# Patient Record
Sex: Male | Born: 1970 | Race: Black or African American | Hispanic: No | Marital: Married | Smoking: Current every day smoker
Health system: Southern US, Community
[De-identification: ages and names within clinical notes are randomized; demographics above are authoritative.]

## PROBLEM LIST (undated history)

## (undated) DIAGNOSIS — E119 Type 2 diabetes mellitus without complications: Secondary | ICD-10-CM

## (undated) DIAGNOSIS — I10 Essential (primary) hypertension: Secondary | ICD-10-CM

## (undated) HISTORY — DX: Type 2 diabetes mellitus without complications: E11.9

## (undated) HISTORY — DX: Essential (primary) hypertension: I10

---

## 2001-01-13 ENCOUNTER — Encounter: Payer: Self-pay | Admitting: *Deleted

## 2001-01-13 ENCOUNTER — Emergency Department (HOSPITAL_COMMUNITY): Admission: EM | Admit: 2001-01-13 | Discharge: 2001-01-13 | Payer: Self-pay | Admitting: *Deleted

## 2004-10-18 ENCOUNTER — Ambulatory Visit (HOSPITAL_COMMUNITY): Admission: RE | Admit: 2004-10-18 | Discharge: 2004-10-18 | Payer: Self-pay | Admitting: Family Medicine

## 2004-12-16 ENCOUNTER — Ambulatory Visit: Payer: Self-pay | Admitting: Cardiology

## 2005-01-22 ENCOUNTER — Ambulatory Visit (HOSPITAL_COMMUNITY): Admission: RE | Admit: 2005-01-22 | Discharge: 2005-01-22 | Payer: Self-pay | Admitting: Family Medicine

## 2005-05-23 ENCOUNTER — Ambulatory Visit (HOSPITAL_COMMUNITY): Admission: RE | Admit: 2005-05-23 | Discharge: 2005-05-23 | Payer: Self-pay | Admitting: Family Medicine

## 2005-06-23 ENCOUNTER — Emergency Department (HOSPITAL_COMMUNITY): Admission: EM | Admit: 2005-06-23 | Discharge: 2005-06-23 | Payer: Self-pay | Admitting: Emergency Medicine

## 2005-06-24 ENCOUNTER — Ambulatory Visit (HOSPITAL_COMMUNITY): Admission: RE | Admit: 2005-06-24 | Discharge: 2005-06-24 | Payer: Self-pay | Admitting: General Surgery

## 2005-07-10 ENCOUNTER — Emergency Department (HOSPITAL_COMMUNITY): Admission: EM | Admit: 2005-07-10 | Discharge: 2005-07-11 | Payer: Self-pay | Admitting: Emergency Medicine

## 2005-08-13 ENCOUNTER — Ambulatory Visit (HOSPITAL_COMMUNITY): Admission: RE | Admit: 2005-08-13 | Discharge: 2005-08-13 | Payer: Self-pay | Admitting: Family Medicine

## 2005-08-15 ENCOUNTER — Encounter (HOSPITAL_COMMUNITY): Admission: RE | Admit: 2005-08-15 | Discharge: 2005-09-14 | Payer: Self-pay | Admitting: Family Medicine

## 2005-10-15 ENCOUNTER — Encounter: Payer: Self-pay | Admitting: Physician Assistant

## 2005-12-03 ENCOUNTER — Encounter: Payer: Self-pay | Admitting: Physician Assistant

## 2006-05-28 ENCOUNTER — Emergency Department (HOSPITAL_COMMUNITY): Admission: EM | Admit: 2006-05-28 | Discharge: 2006-05-29 | Payer: Self-pay | Admitting: Emergency Medicine

## 2006-07-10 ENCOUNTER — Emergency Department (HOSPITAL_COMMUNITY): Admission: EM | Admit: 2006-07-10 | Discharge: 2006-07-10 | Payer: Self-pay | Admitting: Emergency Medicine

## 2006-07-27 ENCOUNTER — Ambulatory Visit: Payer: Self-pay | Admitting: Gastroenterology

## 2006-07-31 ENCOUNTER — Ambulatory Visit (HOSPITAL_COMMUNITY): Admission: RE | Admit: 2006-07-31 | Discharge: 2006-07-31 | Payer: Self-pay | Admitting: Gastroenterology

## 2006-07-31 ENCOUNTER — Encounter (INDEPENDENT_AMBULATORY_CARE_PROVIDER_SITE_OTHER): Payer: Self-pay | Admitting: Specialist

## 2006-07-31 ENCOUNTER — Ambulatory Visit: Payer: Self-pay | Admitting: Gastroenterology

## 2006-09-08 ENCOUNTER — Ambulatory Visit: Payer: Self-pay | Admitting: Gastroenterology

## 2006-11-04 ENCOUNTER — Ambulatory Visit (HOSPITAL_COMMUNITY): Admission: RE | Admit: 2006-11-04 | Discharge: 2006-11-04 | Payer: Self-pay | Admitting: Gastroenterology

## 2006-12-15 ENCOUNTER — Encounter (HOSPITAL_COMMUNITY): Admission: RE | Admit: 2006-12-15 | Discharge: 2007-01-14 | Payer: Self-pay | Admitting: Oncology

## 2006-12-15 ENCOUNTER — Ambulatory Visit (HOSPITAL_COMMUNITY): Payer: Self-pay | Admitting: Oncology

## 2007-02-08 ENCOUNTER — Ambulatory Visit (HOSPITAL_COMMUNITY): Payer: Self-pay | Admitting: Oncology

## 2007-02-08 ENCOUNTER — Encounter (HOSPITAL_COMMUNITY): Admission: RE | Admit: 2007-02-08 | Discharge: 2007-03-10 | Payer: Self-pay | Admitting: Oncology

## 2007-04-29 ENCOUNTER — Encounter (INDEPENDENT_AMBULATORY_CARE_PROVIDER_SITE_OTHER): Payer: Self-pay | Admitting: Urology

## 2007-04-29 ENCOUNTER — Other Ambulatory Visit: Admission: RE | Admit: 2007-04-29 | Discharge: 2007-04-29 | Payer: Self-pay | Admitting: Urology

## 2007-09-01 ENCOUNTER — Ambulatory Visit (HOSPITAL_COMMUNITY): Payer: Self-pay | Admitting: Oncology

## 2007-09-01 ENCOUNTER — Encounter (HOSPITAL_COMMUNITY): Admission: RE | Admit: 2007-09-01 | Discharge: 2007-10-01 | Payer: Self-pay | Admitting: Oncology

## 2008-02-18 ENCOUNTER — Ambulatory Visit (HOSPITAL_COMMUNITY): Admission: RE | Admit: 2008-02-18 | Discharge: 2008-02-18 | Payer: Self-pay | Admitting: Family Medicine

## 2008-07-28 ENCOUNTER — Ambulatory Visit (HOSPITAL_COMMUNITY): Admission: RE | Admit: 2008-07-28 | Discharge: 2008-07-28 | Payer: Self-pay | Admitting: Family Medicine

## 2008-09-05 ENCOUNTER — Ambulatory Visit (HOSPITAL_COMMUNITY): Payer: Self-pay | Admitting: Oncology

## 2008-09-05 ENCOUNTER — Encounter (HOSPITAL_COMMUNITY): Admission: RE | Admit: 2008-09-05 | Discharge: 2008-10-05 | Payer: Self-pay | Admitting: Oncology

## 2008-10-03 ENCOUNTER — Ambulatory Visit (HOSPITAL_COMMUNITY): Admission: RE | Admit: 2008-10-03 | Discharge: 2008-10-03 | Payer: Self-pay | Admitting: Family Medicine

## 2009-07-11 ENCOUNTER — Ambulatory Visit: Payer: Self-pay | Admitting: Family Medicine

## 2009-07-11 DIAGNOSIS — R0789 Other chest pain: Secondary | ICD-10-CM | POA: Insufficient documentation

## 2009-07-11 DIAGNOSIS — G43909 Migraine, unspecified, not intractable, without status migrainosus: Secondary | ICD-10-CM | POA: Insufficient documentation

## 2009-07-11 DIAGNOSIS — L509 Urticaria, unspecified: Secondary | ICD-10-CM | POA: Insufficient documentation

## 2009-07-12 ENCOUNTER — Encounter: Payer: Self-pay | Admitting: Physician Assistant

## 2009-07-17 ENCOUNTER — Telehealth: Payer: Self-pay | Admitting: Physician Assistant

## 2009-08-14 ENCOUNTER — Ambulatory Visit: Payer: Self-pay | Admitting: Family Medicine

## 2009-08-14 DIAGNOSIS — J301 Allergic rhinitis due to pollen: Secondary | ICD-10-CM | POA: Insufficient documentation

## 2009-08-14 DIAGNOSIS — Z86718 Personal history of other venous thrombosis and embolism: Secondary | ICD-10-CM | POA: Insufficient documentation

## 2009-09-04 ENCOUNTER — Encounter: Payer: Self-pay | Admitting: Physician Assistant

## 2009-09-04 ENCOUNTER — Ambulatory Visit (HOSPITAL_COMMUNITY): Payer: Self-pay | Admitting: Oncology

## 2009-09-04 ENCOUNTER — Encounter (HOSPITAL_COMMUNITY): Admission: RE | Admit: 2009-09-04 | Discharge: 2009-10-04 | Payer: Self-pay | Admitting: Oncology

## 2009-10-11 ENCOUNTER — Telehealth: Payer: Self-pay | Admitting: Physician Assistant

## 2009-10-26 ENCOUNTER — Emergency Department (HOSPITAL_COMMUNITY): Admission: EM | Admit: 2009-10-26 | Discharge: 2009-10-26 | Payer: Self-pay | Admitting: Emergency Medicine

## 2010-05-11 ENCOUNTER — Encounter: Payer: Self-pay | Admitting: Cardiovascular Disease

## 2010-05-21 NOTE — Progress Notes (Signed)
Summary: [facility]  [facility]   Imported By: Lind Guest 09/28/2009 15:58:38  _____________________________________________________________________  External Attachment:    Type:   Image     Comment:   External Document

## 2010-05-21 NOTE — Progress Notes (Signed)
Summary: [facility]  [facility]   Imported By: Lind Guest 07/12/2009 14:31:01  _____________________________________________________________________  External Attachment:    Type:   Image     Comment:   External Document

## 2010-05-21 NOTE — Progress Notes (Signed)
  Phone Note From Pharmacy   Caller: [facility] Summary of Call: atacand 8mg  requires prior auth Initial call taken by: Adella Hare LPN,  July 17, 2009 2:39 PM  Follow-up for Phone Call        Let's hold off on this med for now then. Advise pt since it's not covered by his ins we will see how he does with the higher dose of Topamax first. Follow-up by: Esperanza Sheets PA,  July 17, 2009 3:03 PM  Additional Follow-up for Phone Call Additional follow up Details #1::        called patient, number disconnected Additional Follow-up by: Adella Hare LPN,  July 17, 2009 3:29 PM

## 2010-05-21 NOTE — Progress Notes (Signed)
Summary: call  Phone Note Call from Patient   Summary of Call: he has found another doctor Initial call taken by: Lind Guest,  October 11, 2009 9:57 AM  Follow-up for Phone Call        Noted. Follow-up by: Esperanza Sheets PA,  October 11, 2009 10:19 AM

## 2010-05-21 NOTE — Letter (Signed)
Summary: MEDICAL RELEASE  MEDICAL RELEASE   Imported By: Lind Guest 07/12/2009 14:15:08  _____________________________________________________________________  External Attachment:    Type:   Image     Comment:   External Document

## 2010-05-21 NOTE — Progress Notes (Signed)
Summary: [facility]  [facility]   Imported By: Lind Guest 07/12/2009 14:31:48  _____________________________________________________________________  External Attachment:    Type:   Image     Comment:   External Document

## 2010-05-21 NOTE — Assessment & Plan Note (Signed)
Summary: follow up / migraine - room 1   Vital Signs:  Patient profile:   40 year old male Height:      70 inches Weight:      236 pounds BMI:     33.98 O2 Sat:      99 % on Room air Pulse rate:   100 / minute Resp:     16 per minute BP sitting:   140 / 84  (left arm)  Vitals Entered By: Adella Hare LPN (August 14, 2009 4:13 PM) CC: follow up/ migraine, Headache Is Patient Diabetic? No Pain Assessment Patient in pain? yes     Location: head Intensity: 8 Type: sharp Onset of pain  Constant   CC:  follow up/ migraine and Headache.  History of Present Illness: Pt is here today for f/u of migraines.  See prev visit. Has had 6 migraines  in the last mos, and has missed work twice because of it.   He is now taking Topamax 1 am & 2 pm.  No sedation or somnolence problems with.  But hasn't noticed a decrease in freq of headaches either. Used Inderal in the past also but caused ED issues.  Has a migraine today.  started while at work.  Took tylenol while at work.  Unable to take Ultram at work.  He left work early, took 2 Ultram and lied down, but no improvement.  Noticed in last couple of days bilat lower legs feel like they go to sleep.  Happened today while driving in the car. No HS symptoms.  Hx of chronic "back spasms".  No current back pain.  Hx of PE x 2.  See's Dr Mariel Sleet for this.  Has had neg blood work for clotting disorders per pt.  Also having increased seasonal allergy syptoms.  Has allegra prescription for hives.  Has not tried this for his allergies.    Current Medications (verified): 1)  Topamax 50 Mg Tabs (Topiramate) .... Take 1 At Bedtime X 1 Wk, Then 1 Two Times A Day X 1 Week, Then 1 Am & 2 At Bedtime Thereafter 2)  Allegra 180 Mg Tabs (Fexofenadine Hcl) .... Take 1 Daily As Needed For Hives 3)  Tramadol Hcl 50 Mg Tabs (Tramadol Hcl) .... Take 1-2 Q 6 Hrs As Needed For Migraine  Allergies (verified): 1)  ! * Contrast Dye  Past History:  Past  medical history reviewed for relevance to current acute and chronic problems.  Past Medical History: Reviewed history from 07/11/2009 and no changes required. Pulmonary embolism, hx of (2006) Migraines  Review of Systems Eyes:  Complains of light sensitivity. ENT:  Complains of nasal congestion; denies sore throat. CV:  Denies chest pain or discomfort and palpitations. Resp:  Denies cough and shortness of breath. MS:  Denies low back pain and muscle weakness. Neuro:  Complains of tingling; denies numbness. Allergy:  Complains of seasonal allergies and sneezing.  Physical Exam  General:  NAD but does appear uncomfortable.alert, well-developed, well-nourished, and well-hydrated.   Head:  Normocephalic and atraumatic without obvious abnormalities. No apparent alopecia or balding. Eyes:  pupils equal, pupils round, and pupils reactive to light.  Fundoscopic exam difficult due to photophobia. Ears:  External ear exam shows no significant lesions or deformities.  Otoscopic examination reveals clear canals, tympanic membranes are intact bilaterally without bulging, retraction, inflammation or discharge. Hearing is grossly normal bilaterally. Nose:  External nasal examination shows no deformity or inflammation. Nasal mucosa are pink and moist without lesions  or exudates. Mouth:  Oral mucosa and oropharynx without lesions or exudates.  Teeth in good repair. Neck:  No deformities, masses, or tenderness noted. Lungs:  Normal respiratory effort, chest expands symmetrically. Lungs are clear to auscultation, no crackles or wheezes. Heart:  Normal rate and regular rhythm. S1 and S2 normal without gallop, murmur, click, rub or other extra sounds. Pulses:  R radial normal and L radial normal.   Neurologic:  alert & oriented X3, cranial nerves II-XII intact, gait normal, and DTRs symmetrical and normal.   Cervical Nodes:  No lymphadenopathy noted Psych:  Cognition and judgment appear intact. Alert and  cooperative with normal attention span and concentration. No apparent delusions, illusions, hallucinations   Impression & Recommendations:  Problem # 1:  MIGRAINE, CHRONIC (ICD-346.90) Assessment Unchanged Will increase Topamax to max dose of 100 mg two times a day.  If no improv of HA freq in a few weeks pt to call the office. Will try Nucynta for headache.  Rx given. Advised pt if no improv of HA tonight will need to go to ER.  His updated medication list for this problem includes:    Tramadol Hcl 50 Mg Tabs (Tramadol hcl) .Marland Kitchen... Take 1-2 q 6 hrs as needed for migraine    Nucynta 50 Mg Tabs (Tapentadol hcl) .Marland Kitchen... Take one at onset of headache, may take 2nd tab 1 hr later if needed. then 1 every 4-6 hrs as needed for headache  Problem # 2:  PULMONARY EMBOLISM, HX OF (ICD-V12.51) Assessment: Comment Only Will continue follow up with Dr Mariel Sleet.  Problem # 3:  ALLERGIC RHINITIS, SEASONAL (ICD-477.0) Assessment: Deteriorated Advised pt to use the Allegra prescription that he has at home already.  Complete Medication List: 1)  Topamax 50 Mg Tabs (Topiramate) .... Take 2 bid 2)  Allegra 180 Mg Tabs (Fexofenadine hcl) .... Take 1 daily as needed for hives 3)  Tramadol Hcl 50 Mg Tabs (Tramadol hcl) .... Take 1-2 q 6 hrs as needed for migraine 4)  Nucynta 50 Mg Tabs (Tapentadol hcl) .... Take one at onset of headache, may take 2nd tab 1 hr later if needed. then 1 every 4-6 hrs as needed for headache  Patient Instructions: 1)  Please schedule a follow-up appointment in 2 months. 2)  It is important that you exercise regularly at least 20 minutes 5 times a week. If you develop chest pain, have severe difficulty breathing, or feel very tired , stop exercising immediately and seek medical attention. 3)  You need to lose weight. Consider a lower calorie diet and regular exercise.  4)  Increase your Topamax to 100 mg two times a day. 5)  I have prescribed Nucynta for you to try as needed for  headache pain. Prescriptions: NUCYNTA 50 MG TABS (TAPENTADOL HCL) take one at onset of headache, may take 2nd tab 1 hr later if needed. then 1 every 4-6 hrs as needed for headache  #20 x 0   Entered and Authorized by:   Esperanza Sheets PA   Signed by:   Adella Hare LPN on 16/01/9603   Method used:   Print then Give to Patient   RxID:   [id number] TOPAMAX 50 MG TABS (TOPIRAMATE) take 2 bid  #120 x 2   Entered and Authorized by:   Esperanza Sheets PA   Signed by:   Adella Hare LPN on 21/30/8657   Method used:   Electronically to        [facility] [facility] ([facility]ail)  [address]       [place], [place]  [address]       Ph: [phone]       Fax: [phone]   RxID:   [id number]

## 2010-05-21 NOTE — Assessment & Plan Note (Signed)
Summary: NEW PATIENT- room 1   Vital Signs:  Patient profile:   40 year old male Height:      70 inches Weight:      239.50 pounds BMI:     34.49 O2 Sat:      97 % on Room air Pulse rate:   101 / minute Resp:     16 per minute BP sitting:   140 / 60  (left arm)  Vitals Entered By: Adella Hare LPN (July 11, 2009 3:20 PM)  Nutrition Counseling: Patient's BMI is greater than 25 and therefore counseled on weight management options. CC: new patient Is Patient Diabetic? No Pain Assessment Patient in pain? no        CC:  new patient.  History of Present Illness: New pt here to establish care with new PCP. States he was discharged from his prev Dr.  On chronic pain meds.  Had urine drug screen done & did not show up in urine. Takes pain meds for migraines. Was taking Topamax 100mg  once daily, tylox, and another little red pill ( ? inderal). Has seen Dr Peggye Form in the past.  Has had MRI. Also saw neurologist in [place]. Headache wellness center in [place].  On Topamax migraines 2-3 x per mos.   Now off of Topamax, has missed work 15 days since Jan 1st.  Lt sided HA, photo & phonophobia, nausea, occasionally vomits.  No numbness or tingling.  Sometimes gets spots in vision.  No blurred or dble vision.  Maxalt & Relpax didn't help.  Also has been breaking out in a itchy rash/bumps off & on last couple of weeks.  Tried Administrator, Civil [employer] prods, etc & no help.  Hx of PE.  Had neg echocardiogram per pt.  Current Medications (verified): 1)  None  Allergies (verified): 1)  ! * Contrast Dye  Past History:  Past medical, surgical, family and social histories (including risk factors) reviewed for relevance to current acute and chronic problems.  Past Medical History: Pulmonary embolism, hx of (2006) Migraines  Past Surgical History: Vasectomy (2008)  Family History: Reviewed history and no changes required. Mother living- presumed healthy Father deceased- CHF Two  sisters living- presumed healthy  Social History: Reviewed history and no changes required. Employed full time- Equities trader Separated 6 children (ages 8-17) Current Smoker 1/2 pack a day Alcohol use-yes, socially Drug use-no Regular exercise-no Smoking Status:  current Drug Use:  no Does Patient Exercise:  no  Review of Systems Eyes:  Denies blurring and double vision. ENT:  Denies earache, nasal congestion, sinus pressure, and sore throat. CV:  Complains of chest pain or discomfort; denies palpitations. Resp:  Complains of shortness of breath; denies cough. GI:  Complains of nausea. Neuro:  Complains of headaches.  Physical Exam  General:  Well-developed,well-nourished,in no acute distress; alert,appropriate and cooperative throughout examination Head:  Normocephalic and atraumatic without obvious abnormalities. No apparent alopecia or balding. Eyes:  No corneal or conjunctival inflammation noted. EOMI. Perrla. Funduscopic exam benign, without hemorrhages, exudates or papilledema. Vision grossly normal. Ears:  External ear exam shows no significant lesions or deformities.  Otoscopic examination reveals clear canals, tympanic membranes are intact bilaterally without bulging, retraction, inflammation or discharge. Hearing is grossly normal bilaterally. Nose:  External nasal examination shows no deformity or inflammation. Nasal mucosa are pink and moist without lesions or exudates. Mouth:  Oral mucosa and oropharynx without lesions or exudates.  Teeth in good repair. Neck:  No deformities, masses, or tenderness noted.no thyromegaly.  Lungs:  Normal respiratory effort, chest expands symmetrically. Lungs are clear to auscultation, no crackles or wheezes. Heart:  Normal rate and regular rhythm. S1 and S2 normal without gallop, murmur, click, rub or other extra sounds. Pulses:  R radial normal and L radial normal.   Extremities:  No clubbing, cyanosis, edema, or deformity noted with  normal full range of motion of all joints.   Neurologic:  alert & oriented X3, cranial nerves II-XII intact, strength normal in all extremities, sensation intact to light touch, gait normal, and DTRs symmetrical and normal.   Skin:  Intact without suspicious lesions or rashes Cervical Nodes:  No lymphadenopathy noted Psych:  Cognition and judgment appear intact. Alert and cooperative with normal attention span and concentration. No apparent delusions, illusions, hallucinations   Impression & Recommendations:  Problem # 1:  MIGRAINE, CHRONIC (ICD-346.90) Assessment Deteriorated  His updated medication list for this problem includes:    Tramadol Hcl 50 Mg Tabs (Tramadol hcl) .Marland Kitchen... Take 1-2 q 6 hrs as needed for migraine  Problem # 2:  URTICARIA (ICD-708.9) Assessment: New  Problem # 3:  CHEST PAIN, ATYPICAL (ICD-786.59) Assessment: Unchanged  Orders: EKG w/ Interpretation (93000)  Complete Medication List: 1)  Topamax 50 Mg Tabs (Topiramate) .... Take 1 at bedtime x 1 wk, then 1 two times a day x 1 week, then 1 am & 2 at bedtime thereafter 2)  Allegra 180 Mg Tabs (Fexofenadine hcl) .... Take 1 daily as needed for hives 3)  Atacand 8 Mg Tabs (Candesartan cilexetil) .... Take 1 at bedtime 4)  Tramadol Hcl 50 Mg Tabs (Tramadol hcl) .... Take 1-2 q 6 hrs as needed for migraine  Patient Instructions: 1)  Please schedule a follow-up appointment in 1 month. 2)  Sign records release as discussed. 3)  Start meds as discussed. 4)  Tobacco is very bad for your health and your loved ones! You Should stop smoking!. 5)  Stop Smoking Tips: Choose a Quit date. Cut down before the Quit date. decide what you will do as a substitute when you feel the urge to smoke(gum,toothpick,exercise). Prescriptions: TRAMADOL HCL 50 MG TABS (TRAMADOL HCL) take 1-2 q 6 hrs as needed for migraine  #40 x 0   Entered and Authorized by:   Esperanza Sheets PA   Signed by:   Esperanza Sheets PA on 07/11/2009   Method used:    Electronically to        [facility]  [address] [address] (retail)       [address]       [place], [place]  [address]       Ph: [phone]       Fax: [phone]   RxID:   [id number] ATACAND 8 MG TABS (CANDESARTAN CILEXETIL) take 1 at bedtime  #30 x 1   Entered and Authorized by:   Esperanza Sheets PA   Signed by:   Esperanza Sheets PA on 07/11/2009   Method used:   Electronically to        [facility]  [address] [address] [facility] (retail)       [address]       [place], [place]  [address]       Ph: [phone]       Fax: [phone]   RxID:   [id number] ALLEGRA 180 MG TABS (FEXOFENADINE HCL) take 1 daily as needed for hives  #30 x 0  Entered and Authorized by:   Esperanza Sheets PA   Signed by:   Esperanza Sheets PA on 07/11/2009   Method used:   Electronically to        [facility] (retail)       [address]       [place], [place]  [address]       Ph: [phone]       Fax: [phone]   RxID:   [id number] TOPAMAX 50 MG TABS (TOPIRAMATE) take 1 at bedtime x 1 wk, then 1 two times a day x 1 week, then 1 am & 2 at bedtime thereafter  #100 x 1   Entered and Authorized by:   Esperanza Sheets PA   Signed by:   Esperanza Sheets PA on 07/11/2009   Method used:   Electronically to        [facility]  [address] (retail)       [address]       [place], [place]  [address]       Ph: [phone]       Fax: [phone]   RxID:   [id number]

## 2010-07-08 LAB — DIFFERENTIAL
Eosinophils Absolute: 0.1 10*3/uL (ref 0.0–0.7)
Lymphocytes Relative: 43 % (ref 12–46)
Monocytes Relative: 11 % (ref 3–12)
Neutrophils Relative %: 43 % (ref 43–77)

## 2010-07-08 LAB — CBC
HCT: 46.8 % (ref 39.0–52.0)
Hemoglobin: 16.3 g/dL (ref 13.0–17.0)
MCHC: 34.9 g/dL (ref 30.0–36.0)
MCV: 85.2 fL (ref 78.0–100.0)
WBC: 4.6 10*3/uL (ref 4.0–10.5)

## 2010-07-30 LAB — CBC
MCHC: 35 g/dL (ref 30.0–36.0)
RBC: 5.52 MIL/uL (ref 4.22–5.81)
RDW: 13.6 % (ref 11.5–15.5)
WBC: 5.7 10*3/uL (ref 4.0–10.5)

## 2010-09-03 ENCOUNTER — Ambulatory Visit (HOSPITAL_COMMUNITY): Payer: Self-pay | Admitting: Oncology

## 2010-09-03 NOTE — Assessment & Plan Note (Signed)
[name], [name]               CHART#:  [id number]   DATE:  09/08/2006                       DOB:  02-02-1971   REFERRING PHYSICIAN:  Patrica Duel, M.D.   PROBLEM LIST:  1. Right flank pain.  2. History of pulmonary embolus requiring chronic Coumadin therapy.  3. Rectal bleeding secondary to hemorrhoids.  4. Elevated ALT in March 2008 with rare intermittent use of alcohol      and body mass index consistent with obesity.  5. Esophagogastroduodenoscopy in April 2008 with gastric biopsy      showing mild chronic gastritis.   SUBJECTIVE:  The patient is a 40 year old male who presents as a return  patient visit.  He continues to complain of burning on the right side  which may last from seconds to minutes, up to 10 minutes.  He also may  describe this as a sharp sensation.  He is not having this sensation  today.  The pain is random in occurrence.  It is not associated with  eating.  It may occur at work or at home.  When it occurs, it may make  him holler out.  It is not associated with fever or vomiting.  He may  have nausea at times.  He does have problems with constipation and  intermittent loose stools.  He does have problems with rectal bleeding.  His last colonoscopy was in 2007 and he had hemorrhoids.  He has had  rectal suppositories in the past for his rectal bleeding.  The amount of  rectal bleeding he sees is when he wipes and he sees blood on the toilet  paper.  He has not seen any blood in his stools.  The last time he saw  blood in his stool was approximately 2-3 weeks ago.  He used rectal  suppositories for approximately 1 week.   MEDICATIONS:  1. Coumadin 17 mg daily.  2. Tylox or Percocet as needed for headaches.   OBJECTIVE:   PHYSICAL EXAM:  VITAL SIGNS:  Weight 221 pounds.  Height 5 feet 8  inches.  BMI 33.6 (obese).  Temperature 98.1, blood pressure 140/62,  pulse 65.  GENERAL:  He is in no apparent distress, alert and oriented x4.  LUNGS:  Clear  to auscultation bilaterally.  CARDIOVASCULAR:  Regular rhythm.  No murmur.  Normal S1 and S2.  CHEST:  Moderate tenderness to palpation along his right 9th, 10th, 11th and  12th rib.  His chest wall pain is exacerbated with raising his head,  shoulders and arms off the bed.  This pain reproduces his complaint.  ABDOMEN:  Bowel sounds present, soft, tenderness to palpation in the  right upper quadrant, which is made worse when he raises his legs off  the bed and reproduces his complaint.  No rebound or guarding.   ASSESSMENT:  [name] is a 40 year old male who has musculoskeletal  chest wall and abdominal wall pain.  His rectal bleeding is secondary to  hemorrhoids. 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  Thank you for allowing me to see the patient in consultation.  My  recommendations follow.   RECOMMENDATIONS:  1. The patient was given a handout on the management of his      hemorrhoids, constipation and  his right-sided musculoskeletal pain.  2. He is to drink 6-8 cups of water daily.  3. He is given a recommendation on a high-fiber diet.  He is given a      handout.  He should use docusate sodium 100 mg twice daily to      soften his stool for 2 weeks.  He was also given a prescription for      Anucort-HC twice daily for 2 weeks; he may repeat this as needed.      He is given a prescription that says no lifting greater than 10      pounds without assistance for 2 weeks.  4. He may use Tylenol for pain relief as needed.  He also may use      Celebrex 100 mg twice daily for 14 days.  The medication side      effects of Celebrex were discussed and include stroke and heart      attack.  5. He may use Witch Hazel pads for relief of itching or pain.  6. He was given a referral to Dr. Lovell Sheehan if the bleeding continues.      He may see him in 1 month.  7. He may use warm compresses or a heating  pad 3 times a day to his      right flank to facilitate healing of his right muscle strain.  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  Unfortunately,      because of his chronic Coumadin therapy, he is not a candidate for      traditional anti-inflammatory therapy.  8. I also recommended that he use Ben-Gay, Icy-Hot or other topical      therapies for his right side pain 3 times a day.  9. He has a followup appointment to see me in 3 months.  10.I did, because of his history of an elevated ALT in March 2008,      check a hepatic function panel today.  His last consumption of beer      was 3-4 weeks ago.       Kassie Mends, M.D.  Electronically Signed     SM/MEDQ  D:  09/08/2006  T:  09/09/2006  Job:  [id number]   cc:   Kirk Ruths, M.D.

## 2010-09-03 NOTE — Procedures (Signed)
[name], ORREGO            ACCOUNT NO.:  [id number]   MEDICAL RECORD NO.:  [id number]          PATIENT TYPE:  REC   LOCATION:  RAD                           FACILITY:  [facility]   PHYSICIAN:  Edward L. Juanetta Gosling, M.D.DATE OF BIRTH:  [birth year]   DATE OF PROCEDURE:  DATE OF DISCHARGE:                            PULMONARY FUNCTION TEST   1. Spirometry shows no ventilatory defect and no evidence of airflow      obstruction.  2. Lung volumes are normal.  3. DLCO is moderately reduced.  4. Arterial blood gases are normal.  5. There is no significant bronchodilator improvement.      Edward L. Juanetta Gosling, M.D.  Electronically Signed     ELH/MEDQ  D:  12/28/2006  T:  12/29/2006  Job:  [id number]   cc:   Ladona Horns. Mariel Sleet, MD  Fax: [phone]   Oneal Deputy. Juanetta Gosling, M.D.  Fax: [phone]

## 2010-09-06 NOTE — Assessment & Plan Note (Signed)
NAMEJAYLEN, [name]               CHART#:  [id number]   DATE:                                   DOB:  June 25, 1970   REFERRING PHYSICIAN:  Elpidio Eric, M.D.   PROBLEM LIST:  1. Biventricular failure causing ascites.  2. CT scan in May of 2008 with a liver that had nodule hepatic changes      and a small spleen.  3. Defibrillator placed in March 2007.  4. Hypertension.  5. History of alcohol abuse.  6. Normal coronary arteries in 2006 by cardiac catheterization.  7. Tobacco abuse.   SUBJECTIVE:  The patient is a 40 year old male who was initially seen in  June of 2008 because of his ascites.  He had evaluation of his ascites  which was consistent with cardiac ascites and his cytology was negative.  He has been admitted on several occasions for his heart failure, since  June of 2008.  He is doing fairly well at this point.  He has an  appointment at [facility] on February the 6th to be evaluated for cardiac  transplant.  He is trying to quit smoking.  He occasionally has problems  with swelling in his belly and his legs.  He also has an area in his  right groin that sometimes becomes enlarged.  It is off and on the right  groin prominence.  He denies any rectal bleeding or black tarry stools.  He has no family history of colon cancer or colon polyps.   OBJECTIVE:  Weight 134 pounds (down 6 pounds since June 2008).  Height 5  feet 7 inches.  Temperature 97.6.  Blood pressure 100/68.  Pulse 80.  GENERAL:  He is in no apparent distress.  Alert and oriented x4.  LUNGS:  Clear to auscultation bilaterally.  CARDIOVASCULAR EXAM:  A regular rhythm with a systolic murmur.  ABDOMEN:  Bowel sounds are present.  Soft, nontender, and nondistended.  EXTREMITIES:  Without cyanosis or edema.   ASSESSMENT:  The patient is a 40 year old male with biventricular  failure.  If he is not a cardiac transplant candidate, then he does not  need a colonoscopy.  Thank you for allowing me to see the patient  in  consultation.  My recommendations follow.   RECOMMENDATIONS:  1. His ascites is well controlled with his heart failure management.      I have no further recommendations in regard to the mild nodular      changes on the CT scan.  2. We will see the patient back in 6 months and more than likely if he      is considered a cardiac      transplant candidate then he will need a colonoscopy prior to      surgery.  Should most likely be performed at the Transplanting      Institution.       Kassie Mends, M.D.  Electronically Signed     SM/MEDQ  D:  05/18/2007  T:  05/19/2007  Job:  [id number]   cc:   Patrica Duel, M.D.

## 2010-09-06 NOTE — H&P (Signed)
[name], FRAZZINI            ACCOUNT NO.:  [id number]   MEDICAL RECORD NO.:  [id number]          PATIENT TYPE:  AMB   LOCATION:                                FACILITY:  [facility]   PHYSICIAN:  Dalia Heading, M.D.  DATE OF BIRTH:  Jun 27, 1970   DATE OF ADMISSION:  DATE OF DISCHARGE:  LH                                HISTORY & PHYSICAL   CHIEF COMPLAINT:  Hematochezia.   HISTORY OF PRESENT ILLNESS:  The patient is a 40 year old black male who is  referred for endoscopic evaluation.  He needs a colonoscopy for  hematochezia.  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  He has had intermittent hematochezia.  No abdominal pain, weight  loss, nausea, vomiting, diarrhea, constipation, melena have been noted.  He  has never had a colonoscopy.  There is no family history of colon carcinoma.   PAST MEDICAL HISTORY:  As noted above.   PAST SURGICAL HISTORY:  Unremarkable.   CURRENT MEDICATIONS:  Prevacid, Coumadin, Effexor, AcipHex.   ALLERGIES:  No known drug allergies.   SOCIAL HABITS:  Patient smokes one-half pack of cigarettes a day. He denies  any alcohol use.   PHYSICAL EXAMINATION:  GENERAL APPEARANCE:  The patient is a well-developed,  well-nourished black male in no acute distress.  LUNGS:  Clear to auscultation with equal breath sounds bilaterally.  HEART:  Examination reveals regular rate and rhythm without S3, S4 or  murmurs.  ABDOMEN:  Soft, nontender, nondistended.  No hepatosplenomegaly or masses  are noted.  Rectal examination is deferred to the procedure.   IMPRESSION:  Hematochezia.   PLAN:  The patient is scheduled for a colonoscopy on June 24, 2005.  The  risks and benefits of the procedure including bleeding and perforation were  fully explained to the patient, who gave informed consent.      Dalia Heading, M.D.  Electronically Signed     MAJ/MEDQ  D:  06/12/2005  T:  06/12/2005  Job:  [id number]   cc:   [facility]  Fax: [phone]   Patrica Duel, M.D.  Fax: [phone]

## 2010-09-06 NOTE — Op Note (Signed)
NAMEHAMLET, LASECKI            ACCOUNT NO.:  [id number]   MEDICAL RECORD NO.:  [id number]          PATIENT TYPE:  AMB   LOCATION:  DAY                           FACILITY:  [facility]   PHYSICIAN:  Kassie Mends, M.D.      DATE OF BIRTH:  01-Dec-1970   DATE OF PROCEDURE:  07/31/2006  DATE OF DISCHARGE:  07/31/2006                               OPERATIVE REPORT   PROCEDURE:  Esophagogastroduodenoscopy with cold forceps biopsy.   INDICATION FOR EXAM:  Mr. Krzyzanowski is a 40 year old male who presents  with persistent abdominal pain located in his right upper quadrant.  His  abdominal pain did not respond to proton pump inhibitors.  He is being  evaluated for gastritis, duodenitis, peptic ulcer disease or the  possibility of eosinophilic gastritis, H pylori infection or low  likelihood of malt lymphoma.   FINDINGS:  Patchy antral erythema.  Biopsies obtained to evaluate for H  pylori infection.  Otherwise normal esophagus without evidence of  Barrett's, erosions or ulcerations.  Normal duodenal bulb and second  portion of the duodenum.   RECOMMENDATIONS:  1. Resume previous diet but avoid gastric irritants.  He is given a      handout on gastric irritants.  2. He may restart his Coumadin on 08/01/2006.  He is instructed to      begin Lovenox tonight at 100 mg twice daily and given a      prescription for eight syringes.  He is taught by our nursing staff      how to give himself a subcutaneous injection.  3. He is to avoid aspirin and anti-inflammatory drugs indefinitely.  4. He already has a follow-up appointment to see me in 6 weeks.  5. Will call him with the results of his biopsies.   MEDICATIONS:  1. Demerol 50 mg IV.  2. Versed 4 mg IV.   PROCEDURE TECHNIQUE:  Physical exam was performed.  Informed consent was  obtained from the patient after explaining the benefits, risks and  alternatives to the procedure.  The patient was connected to the monitor  and placed in the left  lateral position.  Continuous oxygen provided by  nasal cannula.  IV medicine administered through an indwelling cannula.  After administration of sedation, the patient's esophagus was intubated  and the scope was  advanced under direct visualization to the second portion of the  duodenum.  The scope was removed subsequently by slowly examining the  color, texture, anatomy and integrity of the mucosa on the way out.  The  patient was recovered in endoscopy and discharged home in satisfactory  condition.      Kassie Mends, M.D.  Electronically Signed     SM/MEDQ  D:  08/03/2006  T:  08/04/2006  Job:  [id number]   cc:   Patrica Duel, M.D.  Fax: [phone]

## 2010-10-08 IMAGING — CR DG CHEST 2V
2 series · 2 of 2 positions shown · non-contrast
Comparison: 07/10/2006.

CLINICAL DATA: Cough.  Pleuritic chest pain.  History pulmonary
embolism.

CHEST - 2 VIEW

[view not recorded (1 of 2)]
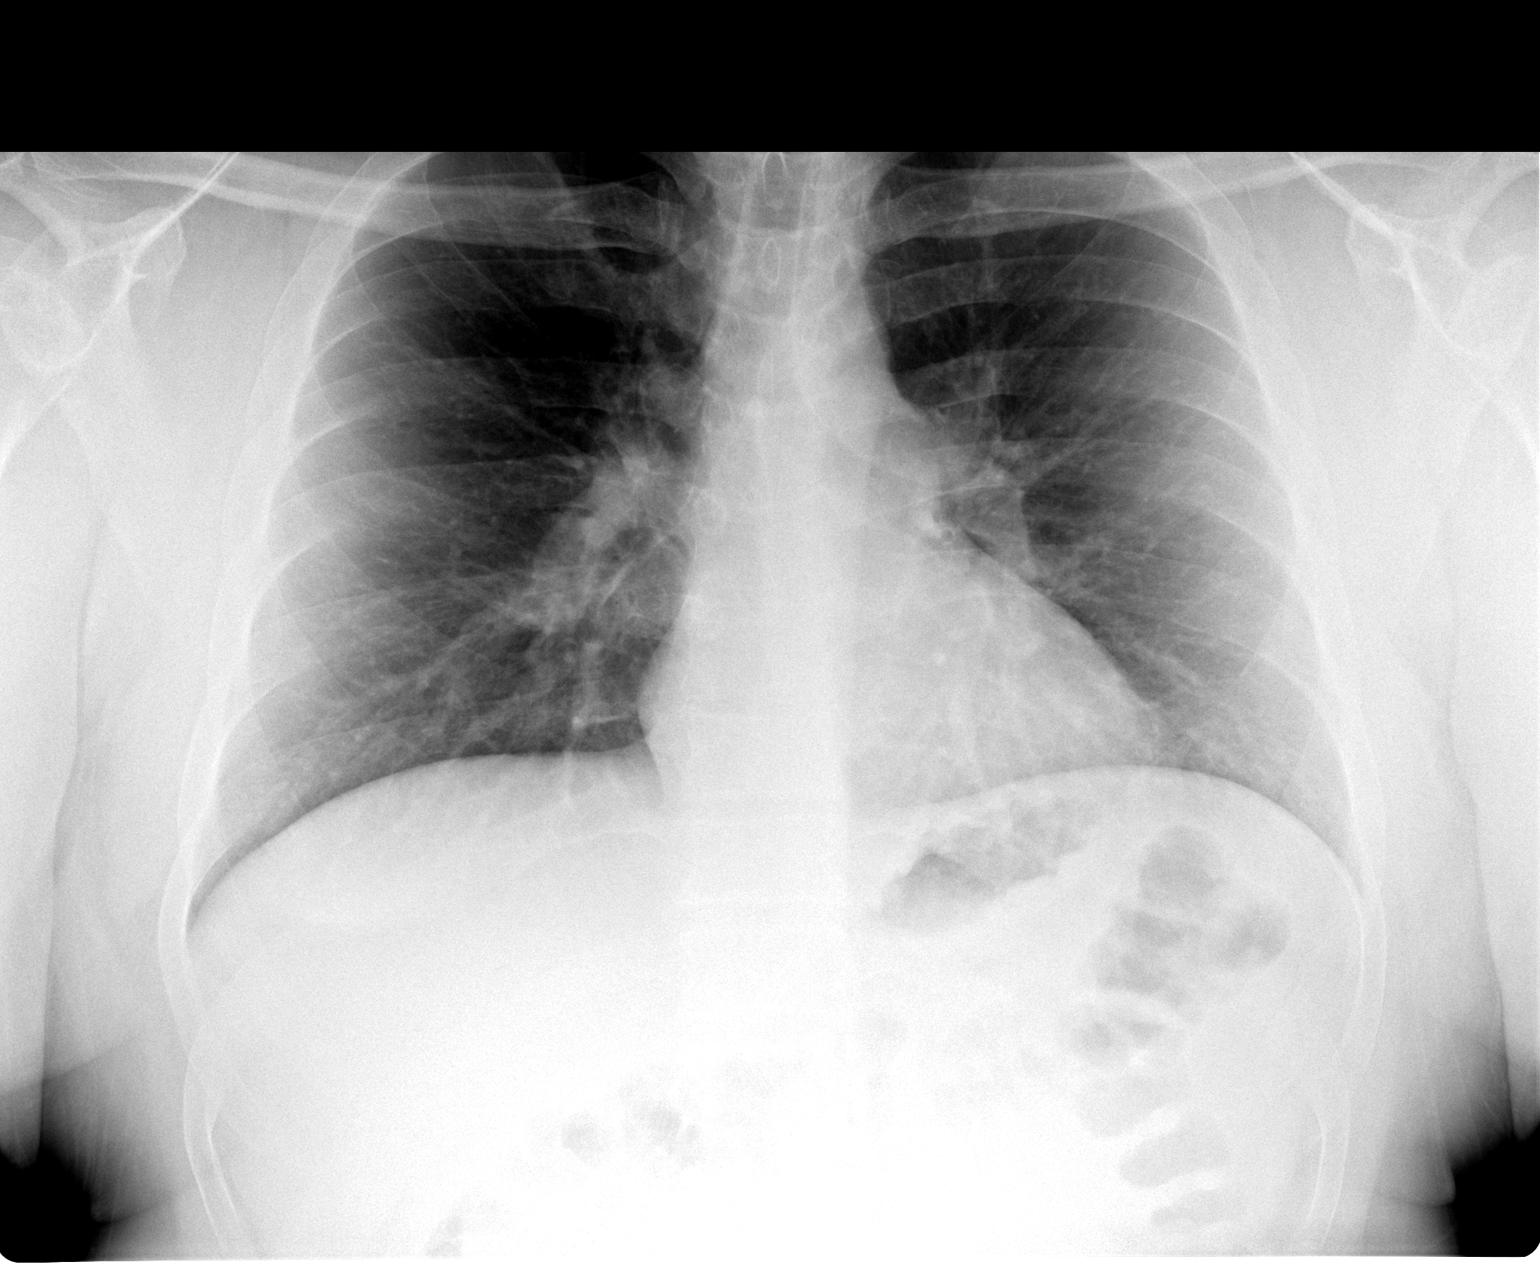

[view not recorded (2 of 2)]
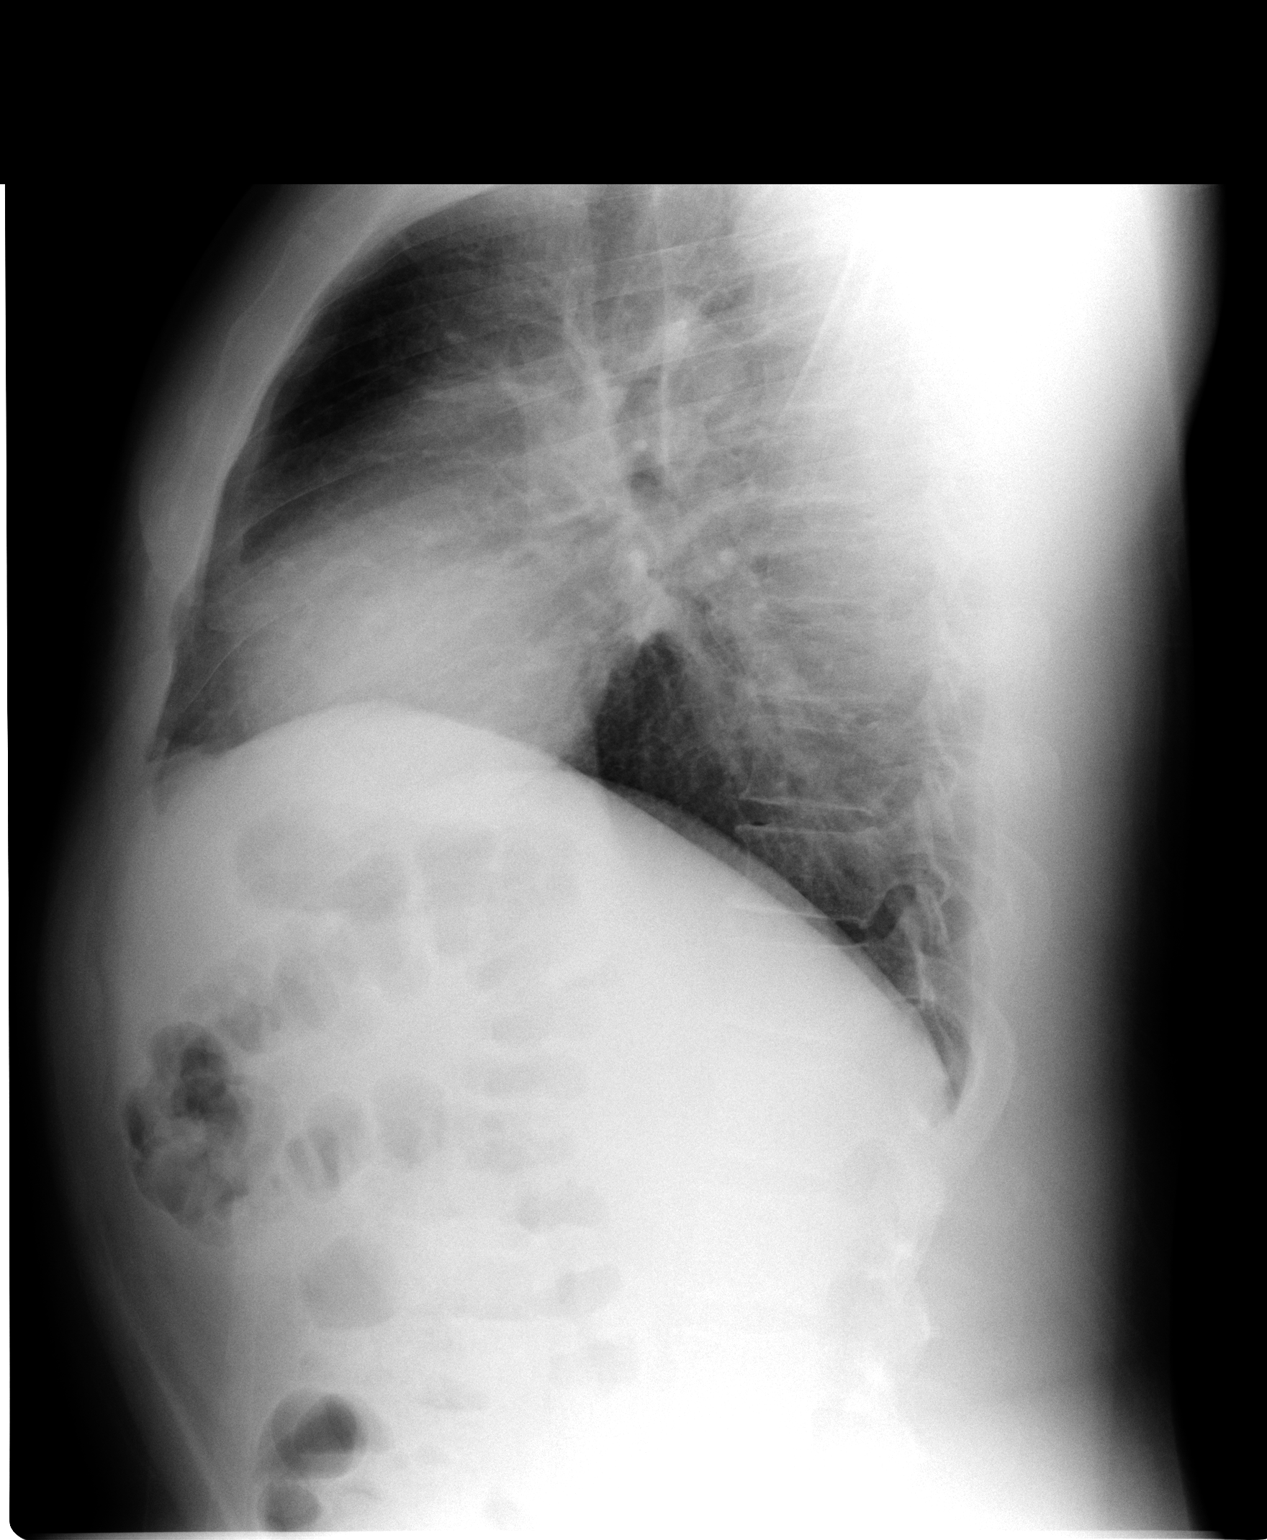

[2 of 2 positions shown; findings below may reference images not displayed]

FINDINGS: Cardiopericardial silhouette and mediastinal contours are
unchanged from prior exam.  Stable prominence of the right hilum.
No airspace disease or effusion.  Trachea is midline.  Paratracheal
stripes appear within normal limits. Minimal atelectasis at both
lung bases.
IMPRESSION: No acute cardiopulmonary disease.  Stable exam.

## 2011-01-28 LAB — SEX HORMONE BINDING GLOBULIN: Sex Hormone Binding: 20 nmol/L (ref 13–71)

## 2011-01-28 LAB — TESTOSTERONE, % FREE: Testosterone-% Free: 2.5 — ABNORMAL HIGH (ref 1.6–2.9)

## 2011-01-28 LAB — CORTISOL-AM, BLOOD: Cortisol - AM: 19.8

## 2011-01-29 LAB — DIFFERENTIAL
Basophils Absolute: 0.1
Basophils Relative: 1
Eosinophils Relative: 2
Lymphocytes Relative: 38
Neutro Abs: 2.6

## 2011-01-29 LAB — MISCELLANEOUS TEST

## 2011-01-29 LAB — CBC
HCT: 48.8
Hemoglobin: 16.6
Platelets: 203
WBC: 5.6

## 2011-01-29 LAB — ERYTHROPOIETIN: Erythropoietin: 10.3

## 2011-01-31 LAB — COMPREHENSIVE METABOLIC PANEL
ALT: 55 — ABNORMAL HIGH
AST: 21
Albumin: 3.9
CO2: 29
Calcium: 9.1
GFR calc Af Amer: >60
Sodium: 139
Total Protein: 7.1

## 2011-01-31 LAB — DIFFERENTIAL
Eosinophils Absolute: 0.1
Eosinophils Relative: 2
Lymphs Abs: 2.4
Monocytes Absolute: 0.5
Monocytes Relative: 10

## 2011-01-31 LAB — BETA-2-GLYCOPROTEIN I ABS, IGG/M/A
Beta-2 Glyco I IgG: 5 U/mL (ref <20)
Beta-2-Glycoprotein I IgA: 6 U/mL (ref <10)
Beta-2-Glycoprotein I IgM: <4 U/mL (ref <10)

## 2011-01-31 LAB — CBC
MCHC: 34.1
Platelets: 189
RBC: 5.98 — ABNORMAL HIGH

## 2011-01-31 LAB — BLOOD GAS, ARTERIAL
FIO2: 0.21
O2 Saturation: 98.1
pCO2 arterial: 40
pO2, Arterial: 106 — ABNORMAL HIGH

## 2011-01-31 LAB — CARDIOLIPIN ANTIBODIES, IGG, IGM, IGA: Anticardiolipin IgG: 7 — ABNORMAL LOW (ref <11)

## 2011-01-31 LAB — PROTEIN S ACTIVITY: Protein S Activity: 78 % (ref 69–129)

## 2011-01-31 LAB — CARBOXYHEMOGLOBIN: O2 Saturation: 98.1

## 2011-01-31 LAB — PROTEIN S, TOTAL: Protein S Ag, Total: 116 % (ref 70–140)

## 2011-01-31 LAB — LUPUS ANTICOAGULANT PANEL: Lupus Anticoagulant: NOT DETECTED

## 2011-01-31 LAB — FACTOR 5 LEIDEN

## 2011-01-31 LAB — ANTITHROMBIN III: AntiThromb III Func: 106 (ref 76–126)

## 2011-10-31 IMAGING — US US EXTREM LOW VENOUS*L*
1 series · 14 of 24 positions shown · non-contrast
Comparison: None

CLINICAL DATA: Left calf pain

LEFT LOWER EXTREMITY VENOUS DOPPLER ULTRASOUND
TECHNIQUE: Gray-scale sonography with compression, as well as color
and duplex ultrasound, were performed to evaluate the deep venous
system from the level of the common femoral vein through the
popliteal and proximal calf veins.

[Series 1: us extrem low venous*left* · 14 of 32 slices shown]
[im 1/32]
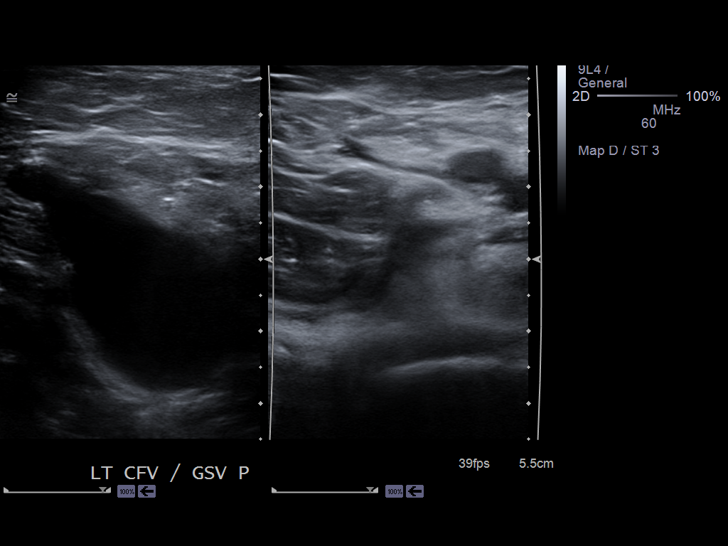
[im 3/32]
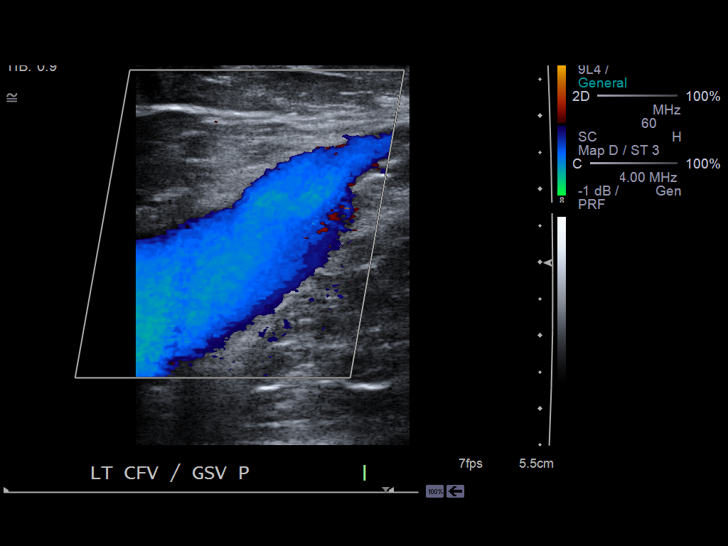
[im 6/32]
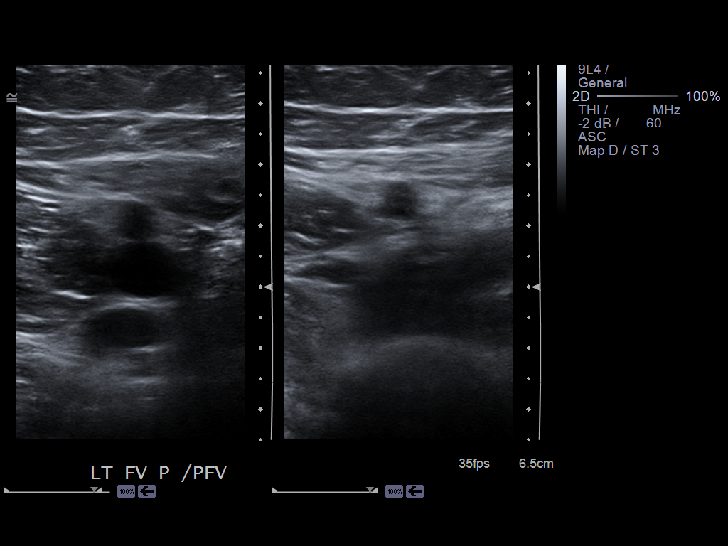
[im 9/32]
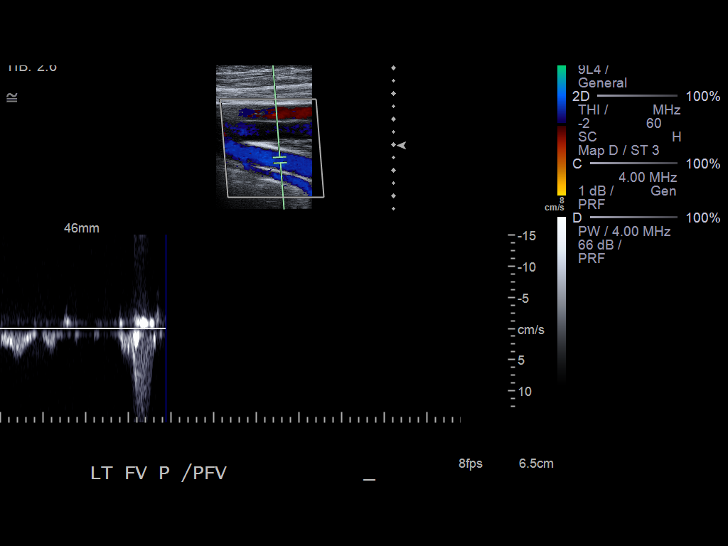
[im 10/32]
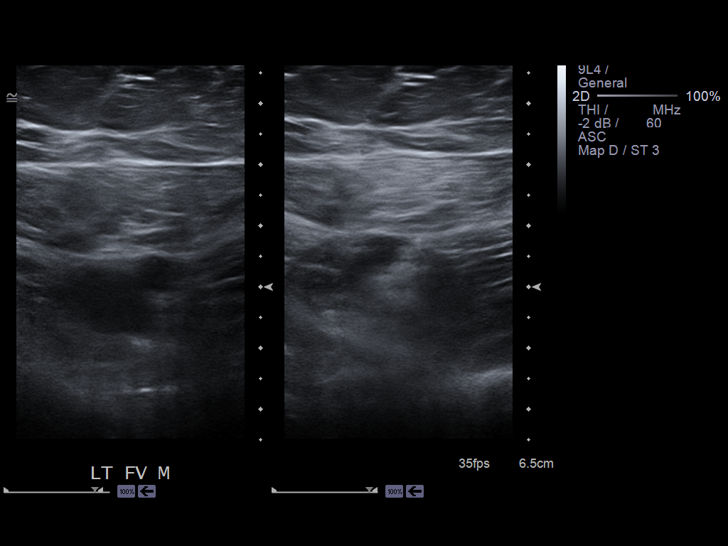
[im 13/32]
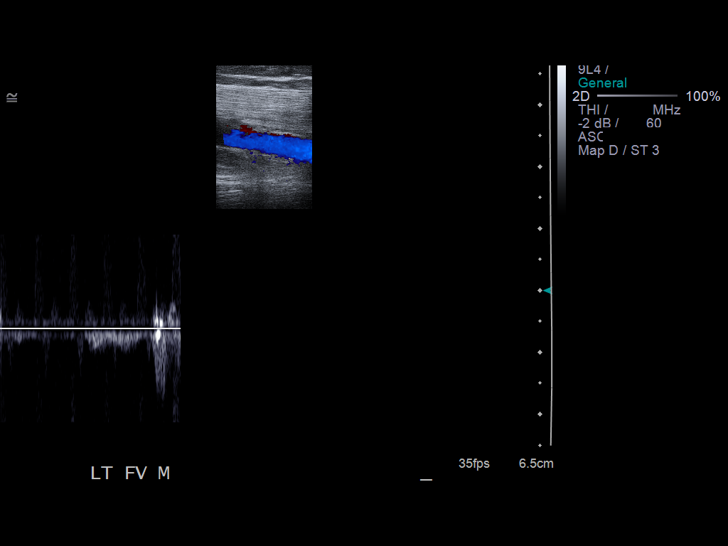
[im 15/32]
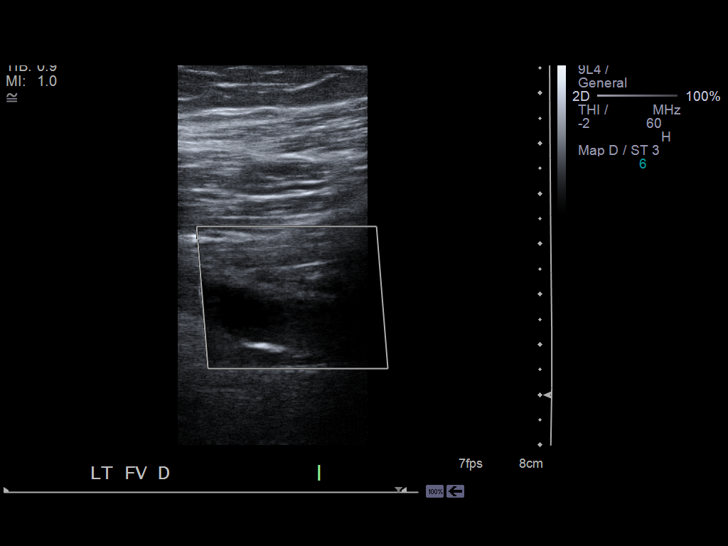
[im 17/32]
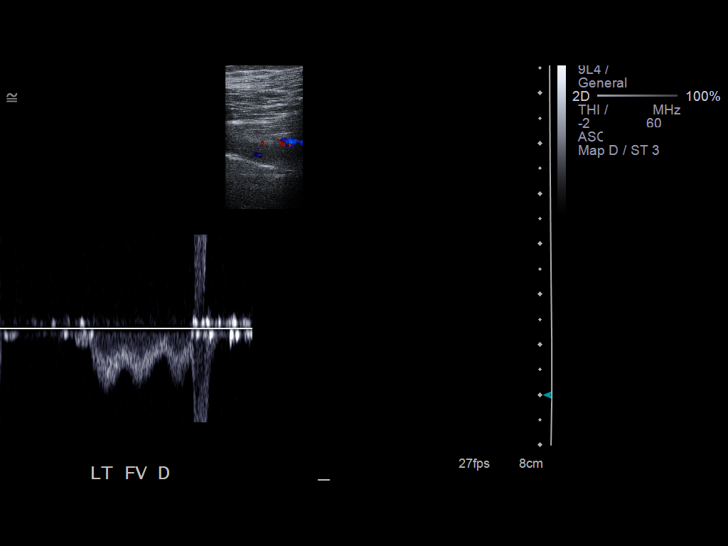
[im 19/32]
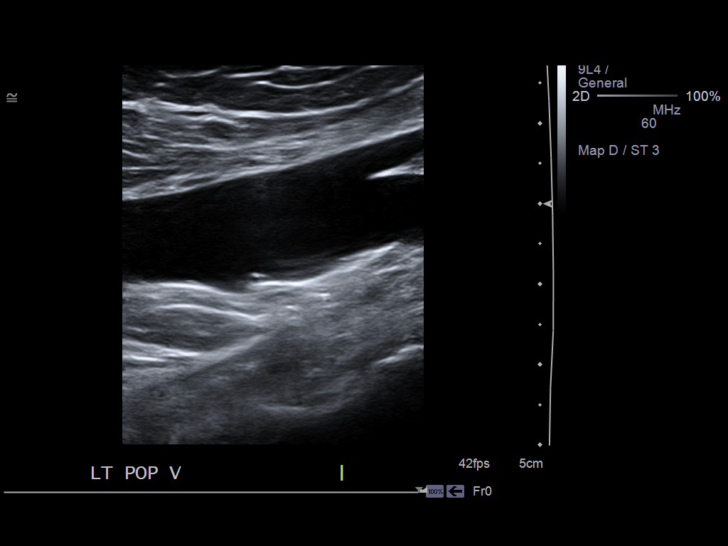
[im 22/32]
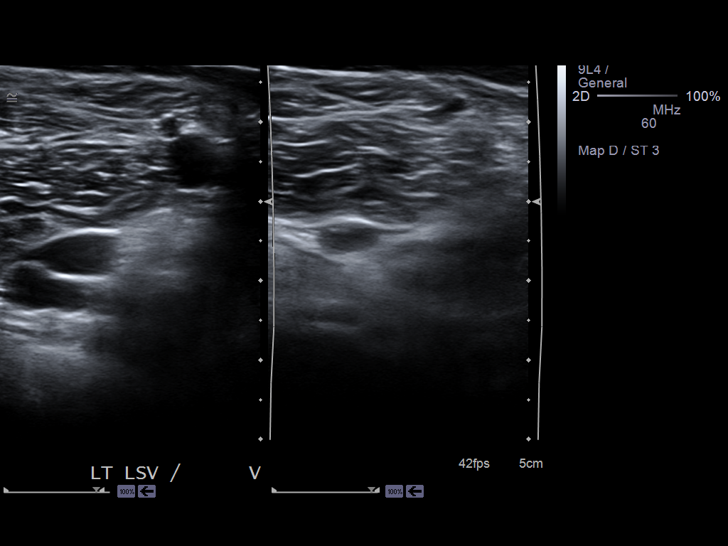
[im 25/32]
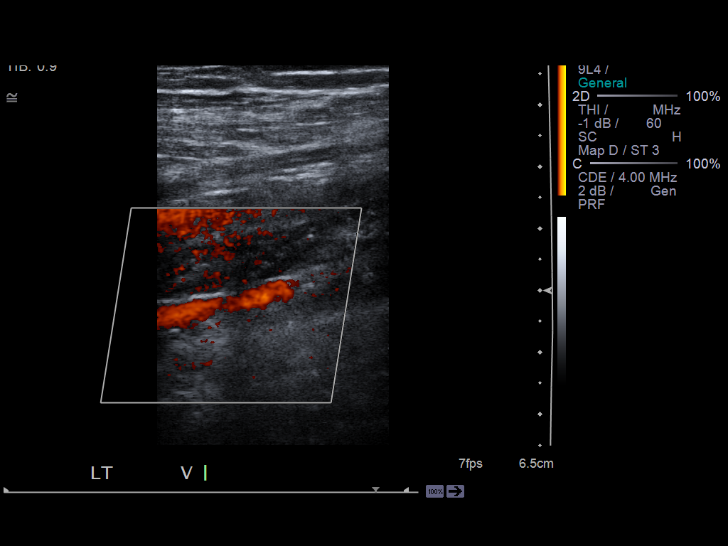
[im 26/32]
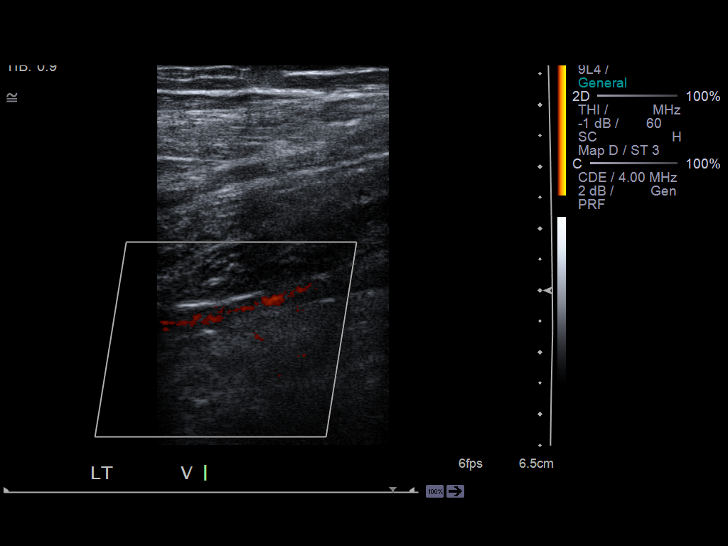
[im 29/32]
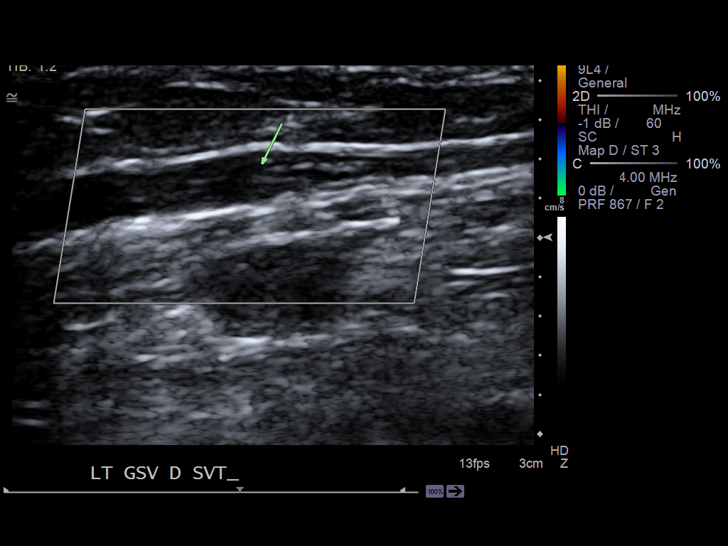
[im 32/32]
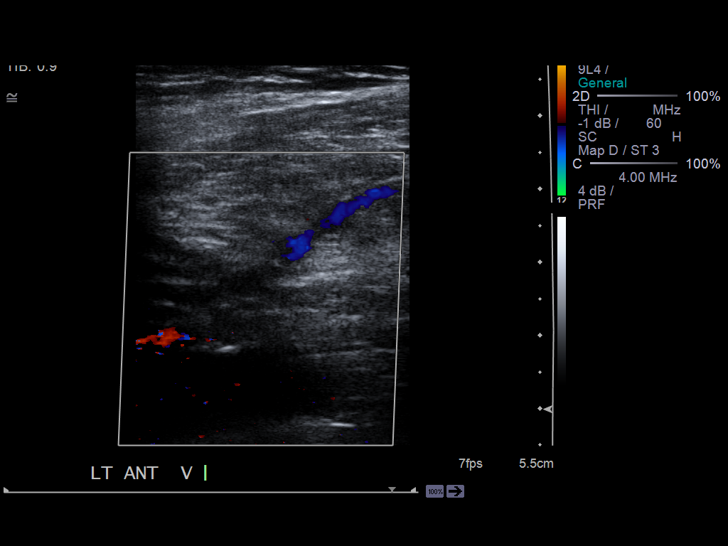

[14 of 24 positions shown; findings below may reference images not displayed]

FINDINGS: Normal compressibility of  the common femoral,
superficial femoral, and popliteal veins, as well as the proximal
calf veins.  No filling defects to suggest DVT on grayscale or
color Doppler imaging.  Doppler waveforms show normal direction of
venous flow, normal respiratory phasicity and response to
augmentation.

There is occlusive   noncompressible thrombus in the left greater
saphenous vein at the   mid calf level.
IMPRESSION: 1. No evidence of  lower extremity deep vein thrombosis.
2.  Superficial thrombophlebitis of the GSV at the mid calf level.

## 2012-06-29 DIAGNOSIS — Z7901 Long term (current) use of anticoagulants: Secondary | ICD-10-CM

## 2012-06-29 DIAGNOSIS — I2699 Other pulmonary embolism without acute cor pulmonale: Secondary | ICD-10-CM

## 2012-06-29 DIAGNOSIS — I82409 Acute embolism and thrombosis of unspecified deep veins of unspecified lower extremity: Secondary | ICD-10-CM

## 2012-08-09 ENCOUNTER — Encounter (INDEPENDENT_AMBULATORY_CARE_PROVIDER_SITE_OTHER): Payer: Self-pay | Admitting: Internal Medicine

## 2012-08-09 DIAGNOSIS — K55059 Acute (reversible) ischemia of intestine, part and extent unspecified: Secondary | ICD-10-CM

## 2012-08-09 DIAGNOSIS — Z7901 Long term (current) use of anticoagulants: Secondary | ICD-10-CM

## 2012-08-09 DIAGNOSIS — E291 Testicular hypofunction: Secondary | ICD-10-CM

## 2013-02-22 DIAGNOSIS — K55059 Acute (reversible) ischemia of intestine, part and extent unspecified: Secondary | ICD-10-CM

## 2013-02-22 DIAGNOSIS — D6859 Other primary thrombophilia: Secondary | ICD-10-CM

## 2013-03-09 DIAGNOSIS — Z86718 Personal history of other venous thrombosis and embolism: Secondary | ICD-10-CM

## 2013-03-09 DIAGNOSIS — L03211 Cellulitis of face: Secondary | ICD-10-CM

## 2013-03-09 DIAGNOSIS — K55059 Acute (reversible) ischemia of intestine, part and extent unspecified: Secondary | ICD-10-CM

## 2013-03-09 DIAGNOSIS — L0201 Cutaneous abscess of face: Secondary | ICD-10-CM

## 2013-03-09 DIAGNOSIS — Z23 Encounter for immunization: Secondary | ICD-10-CM

## 2015-10-12 ENCOUNTER — Other Ambulatory Visit (HOSPITAL_COMMUNITY): Payer: Self-pay

## 2015-10-16 ENCOUNTER — Other Ambulatory Visit (HOSPITAL_COMMUNITY): Payer: Self-pay | Admitting: *Deleted

## 2015-10-16 ENCOUNTER — Encounter (HOSPITAL_COMMUNITY): Payer: Self-pay | Attending: Hematology & Oncology

## 2015-10-16 DIAGNOSIS — Z86718 Personal history of other venous thrombosis and embolism: Secondary | ICD-10-CM | POA: Insufficient documentation

## 2015-10-16 LAB — PROTIME-INR
INR: 1.03 (ref 0.00–1.49)
Prothrombin Time: 13.7 seconds (ref 11.6–15.2)

## 2015-10-17 ENCOUNTER — Other Ambulatory Visit (HOSPITAL_COMMUNITY): Payer: Self-pay | Admitting: *Deleted

## 2015-10-17 DIAGNOSIS — Z86718 Personal history of other venous thrombosis and embolism: Secondary | ICD-10-CM

## 2015-10-19 ENCOUNTER — Other Ambulatory Visit (HOSPITAL_COMMUNITY): Payer: Self-pay

## 2015-10-22 ENCOUNTER — Other Ambulatory Visit (HOSPITAL_COMMUNITY): Payer: Self-pay | Admitting: *Deleted

## 2015-10-22 ENCOUNTER — Other Ambulatory Visit (HOSPITAL_COMMUNITY): Payer: Self-pay | Admitting: Oncology

## 2015-10-22 ENCOUNTER — Encounter (HOSPITAL_COMMUNITY): Payer: Self-pay | Attending: Hematology & Oncology

## 2015-10-22 DIAGNOSIS — Z86718 Personal history of other venous thrombosis and embolism: Secondary | ICD-10-CM

## 2015-10-22 DIAGNOSIS — Z7901 Long term (current) use of anticoagulants: Secondary | ICD-10-CM

## 2015-10-22 LAB — PROTIME-INR
INR: 1.02 (ref 0.00–1.49)
PROTHROMBIN TIME: 13.6 s (ref 11.6–15.2)

## 2015-10-22 NOTE — Progress Notes (Signed)
Pt notified of next INR check and Warfarin level check on Wednesday @ 1:20. He said ok.

## 2015-10-24 ENCOUNTER — Encounter (HOSPITAL_COMMUNITY): Payer: Self-pay

## 2015-10-24 DIAGNOSIS — Z86718 Personal history of other venous thrombosis and embolism: Secondary | ICD-10-CM

## 2015-10-24 LAB — PROTIME-INR
INR: 2.08 — AB (ref 0.00–1.49)
Prothrombin Time: 23.2 seconds — ABNORMAL HIGH (ref 11.6–15.2)

## 2015-10-26 ENCOUNTER — Encounter (HOSPITAL_COMMUNITY): Payer: Self-pay

## 2015-10-26 DIAGNOSIS — Z7901 Long term (current) use of anticoagulants: Secondary | ICD-10-CM

## 2015-10-26 LAB — PROTIME-INR
INR: 2.9 — AB (ref 0.00–1.49)
Prothrombin Time: 29.8 seconds — ABNORMAL HIGH (ref 11.6–15.2)

## 2015-10-30 ENCOUNTER — Other Ambulatory Visit (HOSPITAL_COMMUNITY): Payer: Self-pay | Admitting: Emergency Medicine

## 2015-10-30 ENCOUNTER — Other Ambulatory Visit (HOSPITAL_COMMUNITY): Payer: Self-pay | Admitting: Oncology

## 2015-10-30 DIAGNOSIS — Z7901 Long term (current) use of anticoagulants: Secondary | ICD-10-CM

## 2015-10-31 ENCOUNTER — Encounter (HOSPITAL_COMMUNITY): Payer: Self-pay

## 2015-10-31 DIAGNOSIS — Z7901 Long term (current) use of anticoagulants: Secondary | ICD-10-CM

## 2015-10-31 LAB — PROTIME-INR
INR: 1.14 (ref 0.00–1.49)
Prothrombin Time: 14.8 seconds (ref 11.6–15.2)

## 2015-11-02 ENCOUNTER — Ambulatory Visit (HOSPITAL_COMMUNITY): Payer: Self-pay | Admitting: Hematology & Oncology

## 2015-11-03 NOTE — Progress Notes (Signed)
This encounter was created in error - please disregard.

## 2015-11-07 ENCOUNTER — Other Ambulatory Visit (HOSPITAL_COMMUNITY): Payer: Self-pay

## 2020-10-04 ENCOUNTER — Encounter: Payer: Self-pay | Admitting: "Endocrinology

## 2020-10-04 ENCOUNTER — Other Ambulatory Visit: Payer: Self-pay

## 2020-10-04 ENCOUNTER — Ambulatory Visit (INDEPENDENT_AMBULATORY_CARE_PROVIDER_SITE_OTHER): Payer: BLUE CROSS/BLUE SHIELD | Admitting: "Endocrinology

## 2020-10-04 VITALS — BP 144/81 | HR 84 | Ht 70.0 in | Wt 264.2 lb

## 2020-10-04 DIAGNOSIS — E1165 Type 2 diabetes mellitus with hyperglycemia: Secondary | ICD-10-CM

## 2020-10-04 DIAGNOSIS — E291 Testicular hypofunction: Secondary | ICD-10-CM | POA: Diagnosis not present

## 2020-10-04 DIAGNOSIS — I10 Essential (primary) hypertension: Secondary | ICD-10-CM | POA: Insufficient documentation

## 2020-10-04 DIAGNOSIS — F17200 Nicotine dependence, unspecified, uncomplicated: Secondary | ICD-10-CM

## 2020-10-04 DIAGNOSIS — Z6837 Body mass index (BMI) 37.0-37.9, adult: Secondary | ICD-10-CM

## 2020-10-04 NOTE — Patient Instructions (Signed)
Advice for Weight Management  -For most of [place] the best way to lose weight is by diet management. Generally speaking, diet management means consuming less calories intentionally which over time brings about progressive weight loss.  This can be achieved more effectively by restricting carbohydrate consumption to the minimum possible.  So, it is critically important to know your numbers: how much calorie you are consuming and how much calorie you need. More importantly, our carbohydrates sources should be unprocessed or minimally processed complex starch food items.   Sometimes, it is important to balance nutrition by increasing protein intake (animal or plant source), fruits, and vegetables.  - Whole Food, Plant Predominant Nutrition is highly recommended: Eat Plenty of vegetables, Mushrooms, fruits, Legumes, Whole Grains, Nuts, seeds in lieu of processed meats, processed snacks/pastries red meat, poultry, eggs.  -Sticking to a routine mealtime to eat 3 meals a day and avoiding unnecessary snacks is shown to have a big role in weight control. Under normal circumstances, the only time we lose real weight is when we are hungry, so allow hunger to take place- hunger means no food between meal times, only water.  It is not advisable to starve.   -It is better to avoid simple carbohydrates including: Cakes, Sweet Desserts, Ice Cream, Soda (diet and regular), Sweet Tea, Candies, Chips, Cookies, Store Bought Juices, Alcohol in Excess of  1-2 drinks a day, Lemonade,  Artificial Sweeteners, Doughnuts, Coffee Creamers, "Sugar-free" Products, etc, etc.  This is not a complete list...Marland Kitchen.    -Consulting with certified diabetes educators is proven to provide you with the most accurate and current information on diet.  Also, you may be  interested in discussing diet options/exchanges , we can schedule a visit with [name], RDN, CDE for  individualized nutrition education.  -Exercise: If you are able: 30 -60 minutes a day ,4 days a week, or 150 minutes a week.  The longer the better.  Combine stretch, strength, and aerobic activities.  If you were told in the past that you have high risk for cardiovascular diseases, you may seek evaluation by your heart doctor prior to initiating moderate to intense exercise programs.                                  Additional Care Considerations for Diabetes   -Diabetes  is a chronic disease.  The most important care consideration is regular follow-up with your diabetes care provider with the goal being avoiding or delaying its complications and to take advantage of advances in medications and technology.    - Whole Food, Plant Predominant Nutrition is highly recommended: Eat Plenty of vegetables, Mushrooms, fruits, Legumes, Whole Grains, Nuts, seeds in lieu of processed meats, processed snacks/pastries red meat, poultry, eggs.  -Type 2 diabetes is known to coexist with other important comorbidities such as high blood pressure and high cholesterol.  It is critical to control not only the diabetes but also the high blood pressure and high cholesterol to minimize and delay the risk of complications including coronary artery disease, stroke, amputations, blindness, etc.    - Studies showed that people with diabetes will benefit from a class of medications known as ACE inhibitors and statins.  Unless  there are specific reasons not to be on these medications, the standard of care is to consider getting one from these groups of medications at an optimal doses.  These medications are generally considered safe and proven to help protect the heart and the kidneys.    - People with diabetes are encouraged to initiate and maintain regular follow-up with eye doctors, foot doctors, dentists , and if necessary heart and kidney doctors.     - It is highly recommended that people with diabetes quit smoking or  stay away from smoking, and get yearly  flu vaccine and pneumonia vaccine at least every 5 years.  One other important lifestyle recommendation is to ensure adequate sleep - at least 6-7 hours of uninterrupted sleep at night.  -Exercise: If you are able: 30 -60 minutes a day, 4 days a week, or 150 minutes a week.  The longer the better.  Combine stretch, strength, and aerobic activities.  If you were told in the past that you have high risk for cardiovascular diseases, you may seek evaluation by your heart doctor prior to initiating moderate to intense exercise programs.

## 2020-10-04 NOTE — Progress Notes (Signed)
Endocrinology Consult Note                                            10/04/2020, 7:01 PM   Subjective:    Patient ID: [name], male    DOB: Jan 12, 1971, PCP Carmel Sacramento, PA-C   Past Medical History:  Diagnosis Date   Diabetes mellitus, type II ([place])    Hypertension    History reviewed. No pertinent surgical history. Social History   Socioeconomic History   Marital status: Married    Spouse name: Not on file   Number of children: Not on file   Years of education: Not on file   Highest education level: Not on file  Occupational History   Not on file  Tobacco Use   Smoking status: Every Day    Pack years: 0.00    Types: Cigarettes   Smokeless tobacco: Not on file  Vaping Use   Vaping Use: Never used  Substance and Sexual Activity   Alcohol use: Yes   Drug use: Never   Sexual activity: Not on file  Other Topics Concern   Not on file  Social History Narrative   Not on file   Social Determinants of Health   Financial Resource Strain: Not on file  Food Insecurity: Not on file  Transportation Needs: Not on file  Physical Activity: Not on file  Stress: Not on file  Social Connections: Not on file   Family History  Problem Relation Age of Onset   Heart failure Father    Outpatient Encounter Medications as of 10/04/2020  Medication Sig   pantoprazole (PROTONIX) 40 MG tablet Take 1 tablet by mouth daily.   cetirizine (ZYRTEC) 10 MG tablet Take 10 mg by mouth daily.   ELIQUIS 5 MG TABS tablet Take 1 tablet by mouth 2 (two) times daily.   No facility-administered encounter medications on file as of 10/04/2020.   ALLERGIES: Allergies  Allergen Reactions   Iohexol      Desc: tongue swelling, sob, itching all over.NEEDS PRE MEDS    Tramadol     VACCINATION STATUS:  There is no immunization history on file for this patient.  HPI [name] is 50 y.o. male who presents today with a medical history as above. he is being seen in  consultation for hypogonadism requested by Carmel Sacramento, PA-C.  History is obtained directly from the patient and chart review.  Patient reports that he was diagnosed with hypogonadism for more than 20 years.  He has taken intermittent testosterone supplements, however none in the last 5 years.  His history is complicated by unexplained erythrocytosis with hypercoagulable tendency currently on anticoagulation.  He is following with hematology. His medical history is significant for obesity, type 2 diabetes, hypertension, chronic smoking, -not on optimal treatments. -He follows unhealthy lifestyle.  He smokes.  He does not have regular exercise regiment.  He denies coronary artery disease. He has 6 children ages 32-28.  He is a biological father of all of them. He denies testicular injury, chemotherapy, nor radiation.  He denies any significant head injury.  He denies use of opioids, or marijuana.  He did not complain about libido.    Review of Systems  Constitutional: no recent weight gain/loss, no fatigue, no subjective hyperthermia, no subjective hypothermia Eyes: no blurry vision, no xerophthalmia ENT: no  sore throat, no nodules palpated in throat, no dysphagia/odynophagia, no hoarseness Cardiovascular: no Chest Pain, no Shortness of Breath, no palpitations, no leg swelling Respiratory: no cough, no shortness of breath Gastrointestinal: no Nausea/Vomiting/Diarhhea Musculoskeletal: no muscle/joint aches Skin: no rashes Neurological: no tremors, no numbness, no tingling, no dizziness Psychiatric: no depression, no anxiety  Objective:    Vitals with BMI 10/04/2020 08/14/2009 07/11/2009  Height 5' 10"  5' 10"  5' 10"   Weight 264 lbs 3 oz 236 lbs 239 lbs 8 oz  BMI 37.91 63.1 49.7  Systolic 026 378 588  Diastolic 81 84 60  Pulse 84 100 101    BP (!) 144/81   Pulse 84   Ht 5' 10"  (1.778 m)   Wt 264 lb 3.2 oz (119.8 kg)   BMI 37.91 kg/m   Wt Readings from Last 3 Encounters:  10/04/20  264 lb 3.2 oz (119.8 kg)    Physical Exam  Constitutional:  Body mass index is 37.91 kg/m.,  not in acute distress, normal state of mind Eyes: PERRLA, EOMI, no exophthalmos ENT: moist mucous membranes, no gross thyromegaly, no gross cervical lymphadenopathy Cardiovascular: normal precordial activity, Regular Rate and Rhythm, no Murmur/Rubs/Gallops Respiratory:  adequate breathing efforts, no gross chest deformity, Clear to auscultation bilaterally Gastrointestinal: abdomen soft, Non -tender, No distension, Bowel Sounds present, no gross organomegaly Musculoskeletal: no gross deformities, strength intact in all four extremities Genitourinary system: Normal testicular exam.  Normal external male escutcheon.  No scrotal masses, no hernia.  Testicular size 25 cc approximately on both sides. Skin: moist, warm, no rashes Neurological: no tremor with outstretched hands, Deep tendon reflexes normal in bilateral lower extremities.  CMP ( most recent) CMP     Component Value Date/Time   NA 139 12/15/2006 1536   K 4.0 12/15/2006 1536   CL 106 12/15/2006 1536   CO2 29 12/15/2006 1536   GLUCOSE 97 12/15/2006 1536   BUN 14 12/15/2006 1536   CREATININE 0.89 12/15/2006 1536   CALCIUM 9.1 12/15/2006 1536   PROT 7.1 12/15/2006 1536   ALBUMIN 3.9 12/15/2006 1536   AST 21 12/15/2006 1536   ALT 55 (H) 12/15/2006 1536   ALKPHOS 53 12/15/2006 1536   BILITOT 1.0 12/15/2006 1536   GFRNONAA >60 12/15/2006 1536   GFRAA  12/15/2006 1536    >60        The eGFR has been calculated using the MDRD equation. This calculation has not been validated in all clinical   July 18, 2020 A1c was 6.5%.    Ref Range & Units 4 wk ago  Sodium 135 - 145 mmol/L 140   Potassium 3.5 - 5.0 mmol/L 4.0   Chloride 98 - 107 mmol/L 104   Anion Gap 3 - 11 mmol/L 9   CO2 21.0 - 32.0 mmol/L 27.5   BUN 8 - 20 mg/dL 16   Creatinine 0.80 - 1.30 mg/dL 1.25   BUN/Creatinine Ratio  13   EGFR CKD-EPI Non-African American, Male  mL/min/1.31m 68   EGFR CKD-EPI African American, Male mL/min/1.717m78   Glucose 70 - 179 mg/dL 183 High    Calcium 8.5 - 10.1 mg/dL 8.6   Albumin 3.5 - 5.0 g/dL 3.8   Total Protein 6.0 - 8.0 g/dL 7.4   Total Bilirubin 0.3 - 1.2 mg/dL 0.6   AST 15 - 40 U/L 32   ALT 12 - 78 U/L 56   Alkaline Phosphatase 46 - 116 U/L 75   Resulting Agency     Component 09/19/20  09/05/20 07/09/20  08/24/15 08/10/15 07/06/15  WBC 6.4 6.1 10.5 4.3 4.8 4.0  RBC 6.26 High  5.87 High  6.38 High  5.82 High  6.02 High  6.20 High   HGB 17.5 High  16.5 18.0 High  16.5 16.9 17.4 High   HCT 51.3 High  48.9 52.8 High  47.8 49.7 52.5 High   MCV 81.9 83.3 82.8 82.1 82.6 84.7  MCH 28.0 28.1 28.2 28.4 28.1 28.1  MCHC 34.1 33.7 34.1 34.5 34.0 33.1  RDW 13.2 13.2 13.2 13.6 13.5 13.4  MPV 10.6 High  11.3 High  10.5 High  10.7 High  10.8 High  10.7 High   Platelet 302 247 230 215 227 225  Neutrophils % 47.0 50.1 71.0 46.0 48.3 36.2  Lymphocytes % 39.4 36.0 20.1 43.0 38.4 53.0  Monocytes % 9.8 8.5 8.0 8.3 10.8 8.0  Eosinophils % 2.2 4.6 0.1 1.8 1.5 2.0  Basophils % 1.1 0.5 0.5 0.7 0.8 0.8  Absolute Neutrophils 3.0 3.1 7.4 2.0 2.3 1.5 Low   Absolute Lymphocytes 2.5 2.2 2.1 1.9 1.9 2.1  Absolute Monocytes 0.6 0.5 0.8 -- -- --  Absolute Eosinophils 0.1 0.3 0.0 0.1 0.1 0.1  Absolute Basophils 0.1 0.0 0.1 -- -- --  View all related data    Total testosterone on July 13, 2020 was 91, July 06, 2015 was 78, June 08, 2015 was 128  Assessment & Plan:   1. Hypogonadism, male 2. Uncontrolled type 2 diabetes mellitus with hyperglycemia ([place]) 3. Essential hypertension, benign 4. Class 2 severe obesity due to excess calories with serious comorbidity and body mass index (BMI) of 37.0 to 37.9 in adult ([place]) 5. Current smoker   - [name]  is being seen at a kind request of Claybon Jabs, Dawn M, PA-C. - I have reviewed his available endocrine records and clinically evaluated the patient.  I have reviewed all of his  lab results with him. - Based on these reviews, he has type 2 diabetes, hypertension,  sleep apnea, hypogonadism, polycythemia, obesity,chronic smoking.   He has several contraindications for use of testosterone at this time including erythrocytosis complicated by hypercoagulable state, sleep apnea, chronic ongoing heavy smoking. -I advised him against testosterone treatment at this time. This patient is counseled to engage in healthy lifestyle including lifestyle nutrition-plant predominant whole food, away from processed carbs and processed meats. He will benefit the most from weight loss-this intervention will help him reverse weight problem, diabetes, hypertension, sleep apnea.  He is currently on CPAP.  - he acknowledges that there is a room for improvement in his food and drink choices. - Suggestion is made for him to avoid simple carbohydrates  from his diet including Cakes, Sweet Desserts, Ice Cream, Soda (diet and regular), Sweet Tea, Candies, Chips, Cookies, Store Bought Juices, Alcohol in Excess of  1-2 drinks a day, Artificial Sweeteners,  Coffee Creamer, and "Sugar-free" Products, Lemonade. This will help patient to have more stable blood glucose profile and potentially avoid unintended weight gain.  The patient was counseled on the dangers of tobacco use, and was advised to quit.  Reviewed strategies to maximize success, including removing cigarettes and smoking materials from environment.  In light of his recent A1c of 6.5%, he is given a chance to avoid medications for now.  Admittedly, he has not consistently following with his providers including his hematologist.  He is advised to follow with his hematologist for anticoagulation as well as work-up and treatment of erythrocytosis.  Before his next  visit in 3 months he will go to the lab on fasting to give blood for total, free testosterone and SHBG, thyroid function test, prolactin, PSA, LH/FSH, ferritin, CMP, CBC, and 25-hydroxy  vitamin D.   - I did not initiate any new prescriptions today. - he is advised to maintain close follow up with Carmel Sacramento, PA-C for primary care needs.   - Time spent with the patient: 80 minutes, of which >50% was spent in  counseling him about his type 2 diabetes, hypogonadism, hypertension, and the rest in obtaining information about his symptoms, reviewing his previous labs/studies ( including abstractions from other facilities),  evaluations, and treatments,  and developing a plan to confirm diagnosis and long term treatment based on the latest standards of care/guidelines; and documenting his care.  [name] participated in the discussions, expressed understanding, and voiced agreement with the above plans.  All questions were answered to his satisfaction. he is encouraged to contact clinic should he have any questions or concerns prior to his return visit.  Follow up plan: Return in about 3 months (around 01/04/2021) for Fasting Labs  in AM B4 8.   Glade Lloyd, MD [facility] [address] [place], [place] [address] Phone: [phone]  Fax: [phone]     10/04/2020, 7:01 PM  This note was partially dictated with voice recognition software. Similar sounding words can be transcribed inadequately or may not  be corrected upon review.

## 2021-01-07 ENCOUNTER — Ambulatory Visit: Payer: BLUE CROSS/BLUE SHIELD | Admitting: "Endocrinology
